# Patient Record
Sex: Male | Born: 1975 | Hispanic: No | Marital: Married | Smoking: Never smoker
Health system: Southern US, Community
[De-identification: ages and names within clinical notes are randomized; demographics above are authoritative.]

## PROBLEM LIST (undated history)

## (undated) DIAGNOSIS — J189 Pneumonia, unspecified organism: Secondary | ICD-10-CM

## (undated) DIAGNOSIS — E7800 Pure hypercholesterolemia, unspecified: Secondary | ICD-10-CM

---

## 2012-05-16 ENCOUNTER — Other Ambulatory Visit: Payer: Self-pay

## 2012-05-16 ENCOUNTER — Emergency Department (HOSPITAL_COMMUNITY): Payer: Medicaid - Out of State

## 2012-05-16 ENCOUNTER — Emergency Department (HOSPITAL_COMMUNITY)
Admission: EM | Admit: 2012-05-16 | Discharge: 2012-05-17 | Disposition: A | Discharge status: Home / Self Care | Payer: Medicaid - Out of State | Attending: Emergency Medicine | Admitting: Emergency Medicine

## 2012-05-16 ENCOUNTER — Encounter (HOSPITAL_COMMUNITY): Payer: Self-pay | Admitting: *Deleted

## 2012-05-16 DIAGNOSIS — E7800 Pure hypercholesterolemia, unspecified: Secondary | ICD-10-CM | POA: Insufficient documentation

## 2012-05-16 DIAGNOSIS — Z8701 Personal history of pneumonia (recurrent): Secondary | ICD-10-CM | POA: Insufficient documentation

## 2012-05-16 DIAGNOSIS — R0789 Other chest pain: Secondary | ICD-10-CM | POA: Insufficient documentation

## 2012-05-16 DIAGNOSIS — B9789 Other viral agents as the cause of diseases classified elsewhere: Secondary | ICD-10-CM | POA: Insufficient documentation

## 2012-05-16 DIAGNOSIS — B349 Viral infection, unspecified: Secondary | ICD-10-CM

## 2012-05-16 HISTORY — DX: Pneumonia, unspecified organism: J18.9

## 2012-05-16 HISTORY — DX: Pure hypercholesterolemia, unspecified: E78.00

## 2012-05-16 LAB — CBC
Hemoglobin: 15.9 g/dL (ref 13.0–17.0)
Platelets: 225 10*3/uL (ref 150–400)
RBC: 5.27 MIL/uL (ref 4.22–5.81)
WBC: 12.8 10*3/uL — ABNORMAL HIGH (ref 4.0–10.5)

## 2012-05-16 LAB — BASIC METABOLIC PANEL
CO2: 26 mEq/L (ref 19–32)
Chloride: 103 mEq/L (ref 96–112)
Glucose, Bld: 97 mg/dL (ref 70–99)
Sodium: 140 mEq/L (ref 135–145)

## 2012-05-16 LAB — POCT I-STAT TROPONIN I: Troponin i, poc: 0 ng/mL (ref 0.00–0.08)

## 2012-05-16 MED ORDER — ALBUTEROL SULFATE (5 MG/ML) 0.5% IN NEBU
2.5000 mg | INHALATION_SOLUTION | Freq: Once | RESPIRATORY_TRACT | Status: AC
  Administered 2012-05-17: 2.5 mg via RESPIRATORY_TRACT
  Filled 2012-05-16: qty 0.5

## 2012-05-16 NOTE — ED Notes (Signed)
Pt c/o chills, chest pains, and non-productive cough.  States he is from [place] and was dx with pneumonia 3-4 months ago and is worried about the same today.

## 2012-05-16 NOTE — ED Notes (Signed)
EKG completed and signed by Dr. Ray. 

## 2012-05-17 LAB — D-DIMER, QUANTITATIVE: D-Dimer, Quant: <0.27 ug/mL-FEU (ref 0.00–0.48)

## 2012-05-17 NOTE — ED Notes (Signed)
Pt states his chest still feels tight after tx, but he feels he is breathing better.

## 2012-05-17 NOTE — ED Notes (Signed)
Pt given sandwich per request.  Await papers for discharge.

## 2012-05-21 NOTE — ED Provider Notes (Signed)
History     CSN: [id number]  Arrival date & time 05/16/12  8295   First MD Initiated Contact with Patient 05/16/12 2302      Chief Complaint  Patient presents with  . Chills    (Consider location/radiation/quality/duration/timing/severity/associated sxs/prior treatment) HPI 36 yo male presents to the ER with complaint of cold chills, sharp chest pain, and cough x 3 days.  No documented fevers.  Daughter with URI sxs.  Pt reports pna about 3 mo ago in [place], sxs somewhat similar to prior.  Pt travels extensively for work.  No h/o PE, dvt, leg swelling, pain.    Past Medical History  Diagnosis Date  . Pneumonia   . Hypercholesteremia     History reviewed. No pertinent past surgical history.  History reviewed. No pertinent family history.  History  Substance Use Topics  . Smoking status: Never Smoker   . Smokeless tobacco: Not on file  . Alcohol Use: No      Review of Systems  All other systems reviewed and are negative.    Allergies  Review of patient's allergies indicates no known allergies.  Home Medications   Current Outpatient Rx  Name  Route  Sig  Dispense  Refill  . ACETAMINOPHEN 500 MG PO TABS   Oral   Take 1,000 mg by mouth every 6 (six) hours as needed. For pain         . DAYQUIL PO   Oral   Take 2 capsules by mouth every 6 (six) hours as needed. For cold and flu symptoms           BP 128/76  Pulse 71  Temp 98 F (36.7 C) (Oral)  Resp 20  SpO2 100%  Physical Exam  Nursing note and vitals reviewed. Constitutional: He is oriented to person, place, and time. He appears well-developed and well-nourished.  HENT:  Head: Normocephalic and atraumatic.  Right Ear: External ear normal.  Left Ear: External ear normal.  Nose: Nose normal.  Mouth/Throat: Oropharynx is clear and moist.  Eyes: Conjunctivae normal and EOM are normal. Pupils are equal, round, and reactive to light.  Neck: Normal range of motion. Neck supple. No JVD present. No  tracheal deviation present. No thyromegaly present.  Cardiovascular: Normal rate, regular rhythm, normal heart sounds and intact distal pulses.  Exam reveals no gallop and no friction rub.   No murmur heard. Pulmonary/Chest: Effort normal and breath sounds normal. No stridor. No respiratory distress. He has no wheezes. He has no rales. He exhibits no tenderness.       Cough noted  Abdominal: Soft. Bowel sounds are normal. He exhibits no distension and no mass. There is no tenderness. There is no rebound and no guarding.  Musculoskeletal: Normal range of motion. He exhibits no edema and no tenderness.  Lymphadenopathy:    He has no cervical adenopathy.  Neurological: He is alert and oriented to person, place, and time. He exhibits normal muscle tone. Coordination normal.  Skin: Skin is dry. No rash noted. No erythema. No pallor.  Psychiatric: He has a normal mood and affect. His behavior is normal. Judgment and thought content normal.    ED Course  Procedures (including critical care time)  Results for orders placed during the hospital encounter of 05/16/12  CBC      Component Value Range   WBC 12.8 (*) 4.0 - 10.5 K/uL   RBC 5.27  4.22 - 5.81 MIL/uL   Hemoglobin 15.9  13.0 - 17.0 g/dL  HCT 43.8  39.0 - 52.0 %   MCV 83.1  78.0 - 100.0 fL   MCH 30.2  26.0 - 34.0 pg   MCHC 36.3 (*) 30.0 - 36.0 g/dL   RDW 16.1  09.6 - 04.5 %   Platelets 225  150 - 400 K/uL  BASIC METABOLIC PANEL      Component Value Range   Sodium 140  135 - 145 mEq/L   Potassium 3.8  3.5 - 5.1 mEq/L   Chloride 103  96 - 112 mEq/L   CO2 26  19 - 32 mEq/L   Glucose, Bld 97  70 - 99 mg/dL   BUN 8  6 - 23 mg/dL   Creatinine, Ser 4.09  0.50 - 1.35 mg/dL   Calcium 9.5  8.4 - 81.1 mg/dL   GFR calc non Af Amer >90  >90 mL/min   GFR calc Af Amer >90  >90 mL/min  POCT I-STAT TROPONIN I      Component Value Range   Troponin i, poc 0.00  0.00 - 0.08 ng/mL   Comment 3           D-DIMER, QUANTITATIVE      Component Value  Range   D-Dimer, Quant <0.27  0.00 - 0.48 ug/mL-FEU   Dg Chest 2 View  05/16/2012  *RADIOLOGY REPORT*  Clinical Data: Chest pain.  CHEST - 2 VIEW  Comparison: None.  Findings: Cardiomediastinal silhouette appears normal.  Right lung is clear.  Minimal linear opacity is noted in left lung base consistent with scarring or subsegmental atelectasis.  Bony thorax is intact.  IMPRESSION: Minimal subsegmental atelectasis or scarring seen in left lung base.   Original Report Authenticated By: Lupita Raider.,  M.D.       Labs Reviewed  CBC - Abnormal; Notable for the following:    WBC 12.8 (*)     MCHC 36.3 (*)     All other components within normal limits  BASIC METABOLIC PANEL  POCT I-STAT TROPONIN I  D-DIMER, QUANTITATIVE  LAB REPORT - SCANNED   No results found.  Date: 05/21/2012  Rate: 74  Rhythm: normal sinus rhythm  QRS Axis: normal   Intervals: normal  ST/T Wave abnormalities: normal  Conduction Disutrbances:none  Narrative Interpretation:   Old EKG Reviewed: none available    1. Atypical chest pain   2. Viral syndrome       MDM  36 yo male with 3 days of cough, subjective fever/chills.  W/u negative, suspect viral syndrome        Olivia Mackie, MD 05/21/12 2124

## 2012-10-24 ENCOUNTER — Encounter (HOSPITAL_COMMUNITY): Payer: Self-pay | Admitting: Emergency Medicine

## 2012-10-24 ENCOUNTER — Emergency Department (HOSPITAL_COMMUNITY)
Admission: EM | Admit: 2012-10-24 | Discharge: 2012-10-24 | Disposition: A | Discharge status: Home / Self Care | Payer: Medicaid - Out of State | Attending: Emergency Medicine | Admitting: Emergency Medicine

## 2012-10-24 DIAGNOSIS — Z8701 Personal history of pneumonia (recurrent): Secondary | ICD-10-CM | POA: Insufficient documentation

## 2012-10-24 DIAGNOSIS — H579 Unspecified disorder of eye and adnexa: Secondary | ICD-10-CM | POA: Insufficient documentation

## 2012-10-24 DIAGNOSIS — E7800 Pure hypercholesterolemia, unspecified: Secondary | ICD-10-CM | POA: Insufficient documentation

## 2012-10-24 DIAGNOSIS — R509 Fever, unspecified: Secondary | ICD-10-CM | POA: Insufficient documentation

## 2012-10-24 DIAGNOSIS — R1310 Dysphagia, unspecified: Secondary | ICD-10-CM | POA: Insufficient documentation

## 2012-10-24 DIAGNOSIS — J029 Acute pharyngitis, unspecified: Secondary | ICD-10-CM | POA: Insufficient documentation

## 2012-10-24 DIAGNOSIS — J3489 Other specified disorders of nose and nasal sinuses: Secondary | ICD-10-CM | POA: Insufficient documentation

## 2012-10-24 DIAGNOSIS — R059 Cough, unspecified: Secondary | ICD-10-CM | POA: Insufficient documentation

## 2012-10-24 DIAGNOSIS — R05 Cough: Secondary | ICD-10-CM | POA: Insufficient documentation

## 2012-10-24 LAB — RAPID STREP SCREEN (MED CTR MEBANE ONLY): Streptococcus, Group A Screen (Direct): NEGATIVE

## 2012-10-24 MED ORDER — AZITHROMYCIN 250 MG PO TABS: ORAL_TABLET | ORAL | Status: DC

## 2012-10-24 NOTE — ED Notes (Signed)
PT. REPORTS SORE THROAT , RUNNY NOSE , ITCHY/TEARY EYES FOR 2 DAYS UNRELIEVED BY OTC ALLERGY MEDICATIONS , RESPIRATIONS UNLABORED / AIRWAY INTACT.

## 2012-10-24 NOTE — ED Provider Notes (Signed)
History    This chart was scribed for non-physician practitioner working with Todd Cooper III, MD by Todd Yang, ED Scribe. This patient was seen in room TR04C/TR04C and the patient's care was started at 8:52PM.   CSN: [id number]  Arrival date & time 10/24/12  1916   First MD Initiated Contact with Patient 10/24/12 2052      Chief Complaint  Patient presents with  . Sore Throat    (Consider location/radiation/quality/duration/timing/severity/associated sxs/prior treatment) Patient is a 37 y.o. male presenting with pharyngitis. The history is provided by the patient. No language interpreter was used.  Sore Throat This is a new problem. The current episode started 2 days ago. The problem occurs constantly. The problem has been gradually worsening. Pertinent negatives include no chest pain, no abdominal pain, no headaches and no shortness of breath. Nothing aggravates the symptoms. Nothing relieves the symptoms. He has tried nothing for the symptoms. The treatment provided no relief.    Todd Yang is a 37 y.o. male , with a hx of pneumonia and hypercholesteremia, who presents to the Emergency Department complaining of sudden, progressively worsening, sore throat, onset two days ago (10/22/12).  Associated symptoms include difficulty swallowing, rhinorrhea, non productive cough, itching located at the eyes bilaterally, and fever (subjective PTA, 98.2 at [facility] on 10/24/12). The pt reports he has been experiencing a sore, itchy sensation within his throat for the past two nights, which has prompted his concern and desire to seek medical evaluation at [facility] this evening. The pt has taken tylenol, ibuprofen, and Claritin D which do not provide relief of his sore throat.   The pt does not smoke or drink alcohol.     Past Medical History  Diagnosis Date  . Pneumonia   . Hypercholesteremia     History reviewed. No pertinent past surgical history.  No family history on file.  History   Substance Use Topics  . Smoking status: Never Smoker   . Smokeless tobacco: Not on file  . Alcohol Use: No      Review of Systems  Constitutional: Positive for fever.  HENT: Positive for sore throat, rhinorrhea and trouble swallowing.   Eyes: Positive for itching.  Respiratory: Positive for cough. Negative for shortness of breath.   Cardiovascular: Negative for chest pain.  Gastrointestinal: Negative for abdominal pain.  Neurological: Negative for headaches.  All other systems reviewed and are negative.    Allergies  Review of patient's allergies indicates no known allergies.  Home Medications   Current Outpatient Rx  Name  Route  Sig  Dispense  Refill  . acetaminophen (TYLENOL) 500 MG tablet   Oral   Take 500 mg by mouth every 6 (six) hours as needed. For pain         . diphenhydrAMINE (BENADRYL) 50 MG tablet   Oral   Take 50 mg by mouth at bedtime as needed for allergies.          Marland Kitchen ibuprofen (ADVIL,MOTRIN) 200 MG tablet   Oral   Take 200 mg by mouth every 6 (six) hours as needed for pain.         Marland Kitchen loratadine (CLARITIN) 10 MG tablet   Oral   Take 10 mg by mouth daily.           BP 146/82  Pulse 89  Temp(Src) 98.2 F (36.8 C) (Oral)  Resp 14  SpO2 96%  Physical Exam  Nursing note and vitals reviewed. Constitutional: He is oriented to person, place, and  time. He appears well-developed and well-nourished. No distress.  HENT:  Head: Normocephalic and atraumatic.  Right Ear: Tympanic membrane normal.  Left Ear: Tympanic membrane normal.  Mouth/Throat: Posterior oropharyngeal erythema present. No oropharyngeal exudate.  Eyes: EOM are normal.  Neck: Normal range of motion. Neck supple. No tracheal deviation present.  Cardiovascular: Normal rate, regular rhythm and normal heart sounds.  Exam reveals no gallop and no friction rub.   No murmur heard. Pulmonary/Chest: Effort normal and breath sounds normal. No respiratory distress. He has no wheezes.   Abdominal: Bowel sounds are normal. He exhibits no distension. There is no tenderness.  Musculoskeletal: Normal range of motion.  Lymphadenopathy:    He has cervical adenopathy (mild left sided. ).  Neurological: He is alert and oriented to person, place, and time.  Skin: Skin is warm and dry.  Psychiatric: He has a normal mood and affect. His behavior is normal.    ED Course  Procedures (including critical care time)  DIAGNOSTIC STUDIES: Oxygen Saturation is 96% on room air, normal by my interpretation.    COORDINATION OF CARE:   8:56 PM- Treatment plan discussed with patient. Pt agrees with treatment.     Results for orders placed during the hospital encounter of 10/24/12  RAPID STREP SCREEN      Result Value Range   Streptococcus, Group A Screen (Direct) NEGATIVE  NEGATIVE        No results found.   No diagnosis found.  Pharyngitis, likely viral.  Throat culture pending--patient will be contacted with results if positive.  MDM   I personally performed the services described in this documentation, which was scribed in my presence. The recorded information has been reviewed and is accurate.         Todd Norman, NP 10/24/12 [phone]

## 2012-10-25 LAB — STREP A DNA PROBE

## 2012-10-25 NOTE — ED Provider Notes (Signed)
Medical screening examination/treatment/procedure(s) were performed by non-physician practitioner and as supervising physician I was immediately available for consultation/collaboration.   [name], MD 10/25/12 [phone]

## 2013-06-28 ENCOUNTER — Emergency Department (HOSPITAL_COMMUNITY)
Admission: EM | Admit: 2013-06-28 | Discharge: 2013-06-28 | Disposition: A | Discharge status: Home / Self Care | Payer: Medicaid - Out of State | Attending: Emergency Medicine | Admitting: Emergency Medicine

## 2013-06-28 ENCOUNTER — Encounter (HOSPITAL_COMMUNITY): Payer: Self-pay | Admitting: Emergency Medicine

## 2013-06-28 DIAGNOSIS — Z79899 Other long term (current) drug therapy: Secondary | ICD-10-CM | POA: Insufficient documentation

## 2013-06-28 DIAGNOSIS — F411 Generalized anxiety disorder: Secondary | ICD-10-CM | POA: Insufficient documentation

## 2013-06-28 DIAGNOSIS — R42 Dizziness and giddiness: Secondary | ICD-10-CM

## 2013-06-28 DIAGNOSIS — Z8701 Personal history of pneumonia (recurrent): Secondary | ICD-10-CM | POA: Insufficient documentation

## 2013-06-28 DIAGNOSIS — Z8639 Personal history of other endocrine, nutritional and metabolic disease: Secondary | ICD-10-CM | POA: Insufficient documentation

## 2013-06-28 DIAGNOSIS — Z862 Personal history of diseases of the blood and blood-forming organs and certain disorders involving the immune mechanism: Secondary | ICD-10-CM | POA: Insufficient documentation

## 2013-06-28 LAB — COMPREHENSIVE METABOLIC PANEL
AST: 33 U/L (ref 0–37)
CO2: 27 mEq/L (ref 19–32)
Calcium: 9.9 mg/dL (ref 8.4–10.5)
Creatinine, Ser: 0.97 mg/dL (ref 0.50–1.35)
GFR calc Af Amer: >90 mL/min (ref >90)
GFR calc non Af Amer: >90 mL/min (ref >90)

## 2013-06-28 LAB — URINALYSIS, ROUTINE W REFLEX MICROSCOPIC
Hgb urine dipstick: NEGATIVE
Nitrite: NEGATIVE
Specific Gravity, Urine: 1.006 (ref 1.005–1.030)
Urobilinogen, UA: 0.2 mg/dL (ref 0.0–1.0)

## 2013-06-28 LAB — CBC WITH DIFFERENTIAL/PLATELET
Basophils Absolute: 0 10*3/uL (ref 0.0–0.1)
Eosinophils Absolute: 0.3 10*3/uL (ref 0.0–0.7)
Eosinophils Relative: 3 % (ref 0–5)
HCT: 47.8 % (ref 39.0–52.0)
Lymphocytes Relative: 24 % (ref 12–46)
MCH: 30.4 pg (ref 26.0–34.0)
MCHC: 36.2 g/dL — ABNORMAL HIGH (ref 30.0–36.0)
MCV: 84 fL (ref 78.0–100.0)
Monocytes Absolute: 0.7 10*3/uL (ref 0.1–1.0)
RDW: 12.9 % (ref 11.5–15.5)

## 2013-06-28 NOTE — ED Notes (Signed)
Bed: RU04 Expected date: 06/28/13 Expected time: 5:01 PM Means of arrival: Ambulance Comments: HTN

## 2013-06-28 NOTE — ED Notes (Signed)
Per EMS, Pt ran from top floor of apartment building to bottom floor c/o abdominal pressure and dizziness after eating [place] food.A&Ox4.Pt has hx of anxiety.

## 2013-06-28 NOTE — ED Provider Notes (Signed)
CSN: [id number]     Arrival date & time 06/28/13  1655 History   First MD Initiated Contact with Patient 06/28/13 1711     Chief Complaint  Patient presents with  . Anxiety  . Dizziness   (Consider location/radiation/quality/duration/timing/severity/associated sxs/prior Treatment) HPI Comments: Todd Yang is a 37 y.o. male who is here for evaluation of recurrent feelings of dizziness and ill ease. He presents by EMS for evaluation. He didn't today occurred after eating and when he felt a dizzy, and hot feelings when outside, on a balcony. He stayed outside in the cold air for about 30 minutes. He had to lie down because he felt weak. He did not lose consciousness. Previously, he has episodes like this when he has lost consciousness. He has been evaluated for these incidences several times over the last 15 years. He has had extensive evaluation including MRI and EEG. Apparently, nothing has been found as a causative factor. He is currently in [place], temporarily for a job assignment. He's not on any long-term prescribed medications. He denies chest pain, paresthesias, focal weakness, nausea, vomiting, dysuria, urinary frequency, or difficulty walking. There are no other known modifying factors.  Patient is a 37 y.o. male presenting with anxiety and dizziness. The history is provided by the patient.  Anxiety  Dizziness   Past Medical History  Diagnosis Date  . Pneumonia   . Hypercholesteremia    History reviewed. No pertinent past surgical history. No family history on file. History  Substance Use Topics  . Smoking status: Never Smoker   . Smokeless tobacco: Not on file  . Alcohol Use: No    Review of Systems  Neurological: Positive for dizziness.  All other systems reviewed and are negative.    Allergies  Review of patient's allergies indicates no known allergies.  Home Medications   Current Outpatient Rx  Name  Route  Sig  Dispense  Refill  . acetaminophen  (TYLENOL) 500 MG tablet   Oral   Take 500 mg by mouth every 6 (six) hours as needed. For pain         . azithromycin (ZITHROMAX Z-PAK) 250 MG tablet      Take 2 tabs on first day, then one tab each day until finished   6 tablet   0   . diphenhydrAMINE (BENADRYL) 50 MG tablet   Oral   Take 50 mg by mouth at bedtime as needed for allergies.          Marland Kitchen ibuprofen (ADVIL,MOTRIN) 200 MG tablet   Oral   Take 200 mg by mouth every 6 (six) hours as needed for pain.         Marland Kitchen loratadine (CLARITIN) 10 MG tablet   Oral   Take 10 mg by mouth daily.          BP 144/74  Pulse 89  Temp(Src) 98 F (36.7 C) (Oral)  Resp 16  SpO2 99% Physical Exam  Nursing note and vitals reviewed. Constitutional: He is oriented to person, place, and time. He appears well-developed and well-nourished.  HENT:  Head: Normocephalic and atraumatic.  Right Ear: External ear normal.  Left Ear: External ear normal.  Eyes: Conjunctivae and EOM are normal. Pupils are equal, round, and reactive to light.  Neck: Normal range of motion and phonation normal. Neck supple.  Cardiovascular: Normal rate, regular rhythm, normal heart sounds and intact distal pulses.   Pulmonary/Chest: Effort normal and breath sounds normal. He exhibits no bony tenderness.  Abdominal: Soft.  Normal appearance. There is no tenderness.  Musculoskeletal: Normal range of motion.  Neurological: He is alert and oriented to person, place, and time. No cranial nerve deficit or sensory deficit. He exhibits normal muscle tone. Coordination normal.  Skin: Skin is warm, dry and intact.  Psychiatric: His behavior is normal. Judgment and thought content normal.  He is anxious    ED Course  Procedures (including critical care time) Labs Review Labs Reviewed  CBC WITH DIFFERENTIAL - Abnormal; Notable for the following:    WBC 11.1 (*)    Hemoglobin 17.3 (*)    MCHC 36.2 (*)    All other components within normal limits  COMPREHENSIVE  METABOLIC PANEL - Abnormal; Notable for the following:    Glucose, Bld 132 (*)    ALT 57 (*)    All other components within normal limits  URINALYSIS, ROUTINE W REFLEX MICROSCOPIC   Imaging Review No results found.  EKG Interpretation    Date/Time:  Saturday June 28 2013 17:07:52 EST Ventricular Rate:  93 PR Interval:  130 QRS Duration: 91 QT Interval:  348 QTC Calculation: 433 R Axis:   62 Text Interpretation:  Sinus rhythm Probable inferior infarct, old Baseline wander in lead(s) V1 V5 since last tracing no significant change Confirmed by Effie Shy  MD, Willette Mudry ([id number]) on 06/28/2013 5:11:53 PM            MDM   1. Dizziness       Nonspecific dizziness, recurrent, with negative EP evaluation. He has had extensive evaluation previously, and had another episode today, typical of his usual episodes. It is possible that this represents panic disorder. He is stable for discharge for outpatient management.  Nursing Notes Reviewed/ Care Coordinated, and agree without changes. Applicable Imaging Reviewed.  Interpretation of Laboratory Data incorporated into ED treatment    Plan: Home Medications- usual; Home Treatments and Observation- rest; return here if the recommended treatment, does not improve the symptoms; Recommended follow up- PCP followup 1-2 weeks     Flint Melter, MD 06/29/13 [phone]

## 2013-11-09 ENCOUNTER — Encounter (HOSPITAL_COMMUNITY): Payer: Self-pay | Admitting: Emergency Medicine

## 2013-11-09 ENCOUNTER — Emergency Department (HOSPITAL_COMMUNITY)
Admission: EM | Admit: 2013-11-09 | Discharge: 2013-11-10 | Disposition: A | Discharge status: Home / Self Care | Payer: Medicaid - Out of State | Attending: Emergency Medicine | Admitting: Emergency Medicine

## 2013-11-09 DIAGNOSIS — Z862 Personal history of diseases of the blood and blood-forming organs and certain disorders involving the immune mechanism: Secondary | ICD-10-CM | POA: Insufficient documentation

## 2013-11-09 DIAGNOSIS — Z79899 Other long term (current) drug therapy: Secondary | ICD-10-CM | POA: Insufficient documentation

## 2013-11-09 DIAGNOSIS — Z8701 Personal history of pneumonia (recurrent): Secondary | ICD-10-CM | POA: Insufficient documentation

## 2013-11-09 DIAGNOSIS — Z792 Long term (current) use of antibiotics: Secondary | ICD-10-CM | POA: Insufficient documentation

## 2013-11-09 DIAGNOSIS — R112 Nausea with vomiting, unspecified: Secondary | ICD-10-CM | POA: Insufficient documentation

## 2013-11-09 DIAGNOSIS — R42 Dizziness and giddiness: Secondary | ICD-10-CM | POA: Insufficient documentation

## 2013-11-09 DIAGNOSIS — Z8639 Personal history of other endocrine, nutritional and metabolic disease: Secondary | ICD-10-CM | POA: Insufficient documentation

## 2013-11-09 DIAGNOSIS — R110 Nausea: Secondary | ICD-10-CM

## 2013-11-09 LAB — COMPREHENSIVE METABOLIC PANEL
ALBUMIN: 4.2 g/dL (ref 3.5–5.2)
ALT: 45 U/L (ref 0–53)
AST: 30 U/L (ref 0–37)
Alkaline Phosphatase: 77 U/L (ref 39–117)
BILIRUBIN TOTAL: 0.5 mg/dL (ref 0.3–1.2)
BUN: 10 mg/dL (ref 6–23)
CALCIUM: 9.3 mg/dL (ref 8.4–10.5)
CHLORIDE: 101 meq/L (ref 96–112)
CO2: 25 mEq/L (ref 19–32)
CREATININE: 0.85 mg/dL (ref 0.50–1.35)
GFR calc Af Amer: >90 mL/min (ref >90)
GFR calc non Af Amer: >90 mL/min (ref >90)
Glucose, Bld: 151 mg/dL — ABNORMAL HIGH (ref 70–99)
Potassium: 4.3 mEq/L (ref 3.7–5.3)
Sodium: 139 mEq/L (ref 137–147)
TOTAL PROTEIN: 7.9 g/dL (ref 6.0–8.3)

## 2013-11-09 LAB — CBC WITH DIFFERENTIAL/PLATELET
BASOS ABS: 0 10*3/uL (ref 0.0–0.1)
BASOS PCT: 0 % (ref 0–1)
EOS ABS: 0.8 10*3/uL — AB (ref 0.0–0.7)
EOS PCT: 7 % — AB (ref 0–5)
HEMATOCRIT: 45.5 % (ref 39.0–52.0)
HEMOGLOBIN: 16.5 g/dL (ref 13.0–17.0)
Lymphocytes Relative: 32 % (ref 12–46)
Lymphs Abs: 3.3 10*3/uL (ref 0.7–4.0)
MCH: 30.7 pg (ref 26.0–34.0)
MCHC: 36.3 g/dL — AB (ref 30.0–36.0)
MCV: 84.6 fL (ref 78.0–100.0)
MONO ABS: 0.7 10*3/uL (ref 0.1–1.0)
MONOS PCT: 7 % (ref 3–12)
Neutro Abs: 5.5 10*3/uL (ref 1.7–7.7)
Neutrophils Relative %: 54 % (ref 43–77)
Platelets: 235 10*3/uL (ref 150–400)
RBC: 5.38 MIL/uL (ref 4.22–5.81)
RDW: 13 % (ref 11.5–15.5)
WBC: 10.2 10*3/uL (ref 4.0–10.5)

## 2013-11-09 LAB — URINALYSIS, ROUTINE W REFLEX MICROSCOPIC
Bilirubin Urine: NEGATIVE
Glucose, UA: NEGATIVE mg/dL
Hgb urine dipstick: NEGATIVE
KETONES UR: NEGATIVE mg/dL
LEUKOCYTES UA: NEGATIVE
NITRITE: NEGATIVE
PH: 7 (ref 5.0–8.0)
PROTEIN: NEGATIVE mg/dL
Specific Gravity, Urine: 1.007 (ref 1.005–1.030)
Urobilinogen, UA: 0.2 mg/dL (ref 0.0–1.0)

## 2013-11-09 LAB — LIPASE, BLOOD: LIPASE: 26 U/L (ref 11–59)

## 2013-11-09 MED ORDER — FAMOTIDINE 20 MG PO TABS: 20.0000 mg | ORAL_TABLET | Freq: Two times a day (BID) | ORAL | Status: DC

## 2013-11-09 MED ORDER — ONDANSETRON HCL 4 MG/2ML IJ SOLN: 4.0000 mg | Freq: Once | INTRAMUSCULAR | Status: DC

## 2013-11-09 MED ORDER — ONDANSETRON 4 MG PO TBDP
8.0000 mg | ORAL_TABLET | Freq: Once | ORAL | Status: AC
  Administered 2013-11-09: 8 mg via ORAL
  Filled 2013-11-09: qty 2

## 2013-11-09 MED ORDER — PANTOPRAZOLE SODIUM 40 MG IV SOLR: 40.0000 mg | Freq: Once | INTRAVENOUS | Status: DC

## 2013-11-09 MED ORDER — SODIUM CHLORIDE 0.9 % IV BOLUS (SEPSIS): 1000.0000 mL | Freq: Once | INTRAVENOUS | Status: DC

## 2013-11-09 NOTE — ED Notes (Signed)
The pt is having nausea after eating some food with curry in it 4-5 hours ago.  No diarrhea. .  He had a similar  Episode last year was [facility] and he had no diagnosis

## 2013-11-09 NOTE — Discharge Instructions (Signed)
Nausea, Adult Nausea is the feeling that you have an upset stomach or have to vomit. Nausea by itself is not likely a serious concern, but it may be an early sign of more serious medical problems. As nausea gets worse, it can lead to vomiting. If vomiting develops, there is the risk of dehydration.  CAUSES   Viral infections.  Food poisoning.  Medicines.  Pregnancy.  Motion sickness.  Migraine headaches.  Emotional distress.  Severe pain from any source.  Alcohol intoxication. HOME CARE INSTRUCTIONS  Get plenty of rest.  Ask your caregiver about specific rehydration instructions.  Eat small amounts of food and sip liquids more often.  Take all medicines as told by your caregiver. SEEK MEDICAL CARE IF:  You have not improved after 2 days, or you get worse.  You have a headache. SEEK IMMEDIATE MEDICAL CARE IF:   You have a fever.  You faint.  You keep vomiting or have blood in your vomit.  You are extremely weak or dehydrated.  You have dark or bloody stools.  You have severe chest or abdominal pain. MAKE SURE YOU:  Understand these instructions.  Will watch your condition.  Will get help right away if you are not doing well or get worse. Document Released: 08/03/2004 Document Revised: 03/20/2012 Document Reviewed: 03/08/2011 Exit[employer] Patient Information 2014 [employer].  

## 2013-11-09 NOTE — ED Provider Notes (Signed)
CSN: [id number]     Arrival date & time 11/09/13  2034 History   First MD Initiated Contact with Patient 11/09/13 2340     Chief Complaint  Patient presents with  . Emesis     (Consider location/radiation/quality/duration/timing/severity/associated sxs/prior Treatment) HPI History provided by patient. Patient fasted all day long. For dinner he had a large meal with rice and curry.  Short time later he developed nausea and dizziness, he tried to vomit but was unable to. He went outside and sat on his porch for about 45 minutes and didn't feel better so he presents here for evaluation. He had blood work in triage. He did have some epigastric discomfort but that is now resolved. With symptoms complained of heavy breathing and anxiety. The symptoms are now all gone. He feels relatively better and would prefer to be discharged home. He was given Zofran by triage nurse.  Nausea completely resolved.    Past Medical History  Diagnosis Date  . Pneumonia   . Hypercholesteremia    History reviewed. No pertinent past surgical history. No family history on file. History  Substance Use Topics  . Smoking status: Never Smoker   . Smokeless tobacco: Not on file  . Alcohol Use: No    Review of Systems  Constitutional: Negative for fever and chills.  Respiratory: Negative for shortness of breath.   Cardiovascular: Negative for chest pain.  Gastrointestinal: Positive for nausea. Negative for vomiting.  Genitourinary: Negative for flank pain.  Musculoskeletal: Negative for back pain.  Skin: Negative for rash.  Neurological: Negative for weakness and headaches.  All other systems reviewed and are negative.     Allergies  Review of patient's allergies indicates no known allergies.  Home Medications   Prior to Admission medications   Medication Sig Start Date End Date Taking? Authorizing Provider  acetaminophen (TYLENOL) 500 MG tablet Take 500 mg by mouth every 6 (six) hours as needed. For  pain    Historical Provider, MD  azithromycin (ZITHROMAX Z-PAK) 250 MG tablet Take 2 tabs on first day, then one tab each day until finished 10/24/12   Jimmye Normanavid John Smith, NP  diphenhydrAMINE (BENADRYL) 50 MG tablet Take 50 mg by mouth at bedtime as needed for allergies.     Historical Provider, MD  ibuprofen (ADVIL,MOTRIN) 200 MG tablet Take 200 mg by mouth every 6 (six) hours as needed for pain.    Historical Provider, MD  loratadine (CLARITIN) 10 MG tablet Take 10 mg by mouth daily.    Historical Provider, MD   BP 144/83  Pulse 81  Temp(Src) 98.6 F (37 C) (Oral)  Resp 18  Wt 197 lb (89.359 kg)  SpO2 100% Physical Exam  Constitutional: He is oriented to person, place, and time. He appears well-developed and well-nourished.  HENT:  Head: Normocephalic and atraumatic.  Mouth/Throat: Oropharynx is clear and moist.  Eyes: EOM are normal. Pupils are equal, round, and reactive to light.  Neck: Neck supple.  Cardiovascular: Normal rate, regular rhythm and intact distal pulses.   Pulmonary/Chest: Effort normal and breath sounds normal. No respiratory distress.  Abdominal: Soft. Bowel sounds are normal. He exhibits no distension. There is no tenderness. There is no rebound and no guarding.  Musculoskeletal: Normal range of motion. He exhibits no edema.  Neurological: He is alert and oriented to person, place, and time.  Skin: Skin is warm and dry.    ED Course  Procedures (including critical care time) Labs Review Labs Reviewed  CBC WITH DIFFERENTIAL -  Abnormal; Notable for the following:    MCHC 36.3 (*)    Eosinophils Relative 7 (*)    Eosinophils Absolute 0.8 (*)    All other components within normal limits  COMPREHENSIVE METABOLIC PANEL - Abnormal; Notable for the following:    Glucose, Bld 151 (*)    All other components within normal limits  LIPASE, BLOOD  URINALYSIS, ROUTINE W REFLEX MICROSCOPIC   zofran ODT  On recheck is asymptomatic, no abdominal pain and on exam is  soft, nontender and nondistended.   Patient declines IV fluids and IV Protonix.   He is agreeable to prescription for Pepcid. [place] in [place] on traveling back to [place] in a few months. He agrees to strict return precautions. He is a reliable historian and is stable and appropriate for discharge this time.   MDM   Diagnosis: Nausea after ingesting a large meal  Otherwise healthy adult male, presents with nausea and abdominal discomfort after eating a large meal consisting of rice and currry. At time of my evaluation he is feeling better. He had Zofran per triage nurse. He had labs and urinalysis were obtained and reviewed as above. No leukocytosis. Normal LFTs. Normal lipase. Vital signs and nursing notes reviewed. No acute abdomen or indication for abdominal imaging at this time.     Sunnie NielsenBrian Tasharra Nodine, MD 11/09/13 [phone]

## 2013-11-09 NOTE — ED Notes (Signed)
Pt reports he is feeling better since nausea medication.

## 2015-11-01 ENCOUNTER — Ambulatory Visit (INDEPENDENT_AMBULATORY_CARE_PROVIDER_SITE_OTHER): Payer: Managed Care, Other (non HMO) | Admitting: Physician Assistant

## 2015-11-01 VITALS — BP 112/76 | HR 68 | Temp 97.9°F | Resp 18 | Wt 196.0 lb

## 2015-11-01 DIAGNOSIS — J309 Allergic rhinitis, unspecified: Secondary | ICD-10-CM | POA: Diagnosis not present

## 2015-11-01 DIAGNOSIS — J029 Acute pharyngitis, unspecified: Secondary | ICD-10-CM | POA: Diagnosis not present

## 2015-11-01 LAB — POCT RAPID STREP A (OFFICE): RAPID STREP A SCREEN: NEGATIVE

## 2015-11-01 MED ORDER — AZELASTINE HCL 0.1 % NA SOLN: 2.0000 | Freq: Two times a day (BID) | NASAL | Status: AC

## 2015-11-01 MED ORDER — MONTELUKAST SODIUM 10 MG PO TABS: 10.0000 mg | ORAL_TABLET | Freq: Every day | ORAL | Status: AC

## 2015-11-01 NOTE — Addendum Note (Signed)
Addended by: Damita LackLORING, DONNA S on: 11/01/2015 06:55 PM   Modules accepted: Orders

## 2015-11-01 NOTE — Progress Notes (Signed)
[facility] [address], [place] [address] [phone]  Date:  11/01/2015   Name:  Todd Yang   DOB:  01-19-1976   MRN:  [id number]  PCP:  No PCP Per Patient    Chief Complaint: Sore Throat and Nasal Congestion   History of Present Illness:  This is a 40 y.o. male with PMH allergic rhinitis who is presenting with problems with allergies x 1 month. Complaining of sore throat and nasal congestion. States his throat is becoming very sore and feels like there is something stuck in his throat, esp when he swallows. Nose is starting to feel very sore and there is blood on the tissue in the mornings when he blows his nose. His regular doctor gave him a 30 day sample of zyrtec. He has been taking and not helping. He is not taking anything else for his allergies. States he tried flonase a few years ago and did not help. He gets these same symptoms every year around this time and states he usually ends up needing a zpak. He denies fever, chills, cough.  He has never tried singulair or any other nasal sprays.  Review of Systems:  Review of Systems See HPI  There are no active problems to display for this patient.   Prior to Admission medications   Medication Sig Start Date End Date Taking? Authorizing Provider  cetirizine (ZYRTEC) 10 MG tablet Take 10 mg by mouth daily.   Yes Historical Provider, MD  acetaminophen (TYLENOL) 500 MG tablet Take 500 mg by mouth every 6 (six) hours as needed. Reported on 11/01/2015   prn Historical Provider, MD  diphenhydrAMINE (BENADRYL) 50 MG tablet Take 50 mg by mouth at bedtime as needed for allergies. Reported on 11/01/2015   prn Historical Provider, MD    No Known Allergies  History reviewed. No pertinent past surgical history.  Social History  Substance Use Topics  . Smoking status: Never Smoker   . Smokeless tobacco: None  . Alcohol Use: No    History reviewed. No pertinent family history.  Medication list has been  reviewed and updated.  Physical Examination:  Physical Exam  Constitutional: He is oriented to person, place, and time. He appears well-developed and well-nourished. No distress.  HENT:  Head: Normocephalic and atraumatic.  Right Ear: Hearing, external ear and ear canal normal. Tympanic membrane is retracted.  Left Ear: Hearing, external ear and ear canal normal. Tympanic membrane is retracted.  Nose: Nose normal.  Mouth/Throat: Uvula is midline and mucous membranes are normal. Posterior oropharyngeal erythema present. No oropharyngeal exudate, posterior oropharyngeal edema or tonsillar abscesses.  Eyes: Conjunctivae and lids are normal. Right eye exhibits no discharge. Left eye exhibits no discharge. No scleral icterus.  Cardiovascular: Normal rate, regular rhythm, normal heart sounds and normal pulses.   No murmur heard. Pulmonary/Chest: Effort normal and breath sounds normal. No respiratory distress. He has no wheezes. He has no rhonchi. He has no rales.  Musculoskeletal: Normal range of motion.  Lymphadenopathy:       Head (right side): No submental, no submandibular and no tonsillar adenopathy present.       Head (left side): No submental, no submandibular and no tonsillar adenopathy present.    He has no cervical adenopathy.  Neurological: He is alert and oriented to person, place, and time.  Skin: Skin is warm, dry and intact. No lesion and no rash noted.  Psychiatric: He has a normal mood and affect. His speech is  normal and behavior is normal. Thought content normal.   BP 112/76 mmHg  Pulse 68  Temp(Src) 97.9 F (36.6 C) (Oral)  Resp 18  Wt 196 lb (88.905 kg)  SpO2 99%  Results for orders placed or performed in visit on 11/01/15  POCT rapid strep A  Result Value Ref Range   Rapid Strep A Screen Negative Negative   Assessment and Plan:  1. Sore throat 2. Allergic rhinitis Rapid strep negative, culture pending. Pt is adamant about needing antibiotics, stating that is  the only thing that helps his allergies. Allergies are uncontrolled and he has not found a regimen that works well for him. I am going to have him stop the zyrtec, start singulair QHS and start astelin nasal spray 2 sprays in each nostril once a day. If still not better in 2 weeks, he will call, and I will prescribe a zpak at that time. - POCT rapid strep A - montelukast (SINGULAIR) 10 MG tablet; Take 1 tablet (10 mg total) by mouth at bedtime.  Dispense: 30 tablet; Refill: 1 - azelastine (ASTELIN) 0.1 % nasal spray; Place 2 sprays into both nostrils 2 (two) times daily. Use in each nostril as directed  Dispense: 30 mL; Refill: 1   Bettyanne Dittman V. Dyke BrackettBush, PA-C, MHS [facility]  11/01/2015

## 2015-11-01 NOTE — Patient Instructions (Addendum)
Stop zyrtec Start singulair at night before bed. Use astelin nasal spray, 2 sprays in each nostril in the mornings. If you are not getting better in 2 weeks, call me and i will send in a zpak.    IF you received an x-ray today, you will receive an invoice from [facility]. Please contact [facility] at [phone] with questions or concerns regarding your invoice.   IF you received labwork today, you will receive an invoice from [facility]. Please contact [facility] at [phone] with questions or concerns regarding your invoice.   Our billing staff will not be able to assist you with questions regarding bills from these companies.  You will be contacted with the lab results as soon as they are available. The fastest way to get your results is to activate your My Chart account. Instructions are located on the last page of this paperwork. If you have not heard from us regarding the results in 2 weeks, please contact this office.

## 2015-11-04 LAB — CULTURE, GROUP A STREP: ORGANISM ID, BACTERIA: NORMAL

## 2015-11-07 ENCOUNTER — Encounter: Payer: Self-pay | Admitting: *Deleted

## 2019-04-29 ENCOUNTER — Other Ambulatory Visit: Payer: Self-pay | Admitting: Family Medicine

## 2019-04-29 DIAGNOSIS — R7401 Elevation of levels of liver transaminase levels: Secondary | ICD-10-CM

## 2019-05-07 ENCOUNTER — Ambulatory Visit
Admission: RE | Admit: 2019-05-07 | Discharge: 2019-05-07 | Disposition: A | Discharge status: Home / Self Care | Payer: 59 | Source: Ambulatory Visit | Attending: Family Medicine | Admitting: Family Medicine

## 2019-05-07 DIAGNOSIS — R7401 Elevation of levels of liver transaminase levels: Secondary | ICD-10-CM

## 2019-11-20 IMAGING — US US ABDOMEN LIMITED
1 series · 14 of 25 positions shown · non-contrast
Comparison: None.

CLINICAL DATA: Elevated transaminase

EXAM:
ULTRASOUND ABDOMEN LIMITED RIGHT UPPER QUADRANT

[Series 1: us abdomen limited · 0.16mm/px · 14 of 50 slices shown]
[im 1/50]
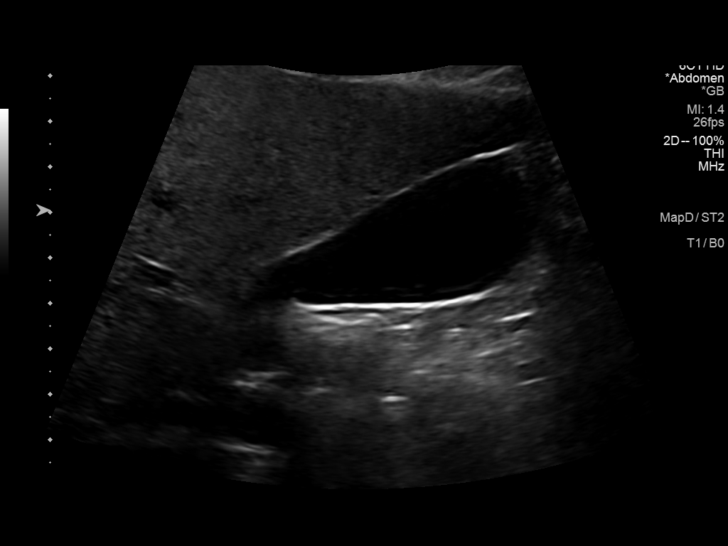
[im 5/50]
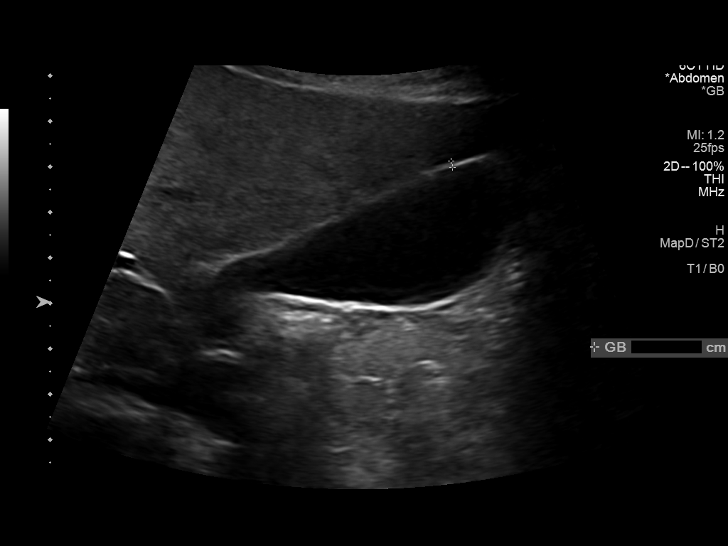
[im 9/50]
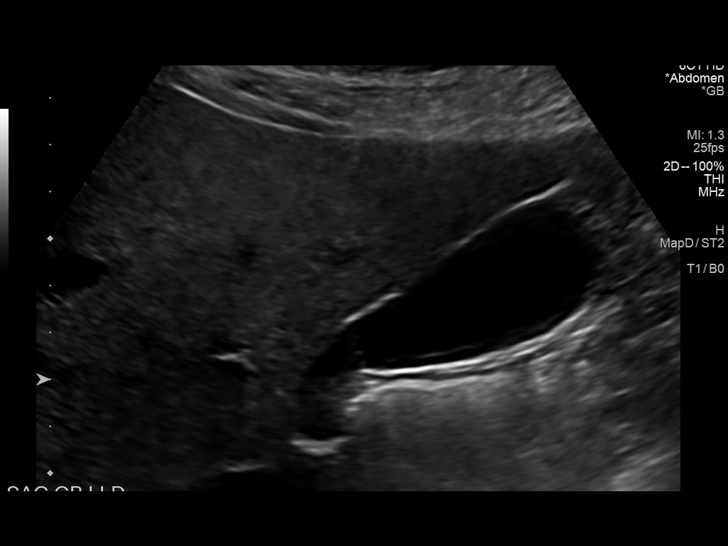
[im 13/50]
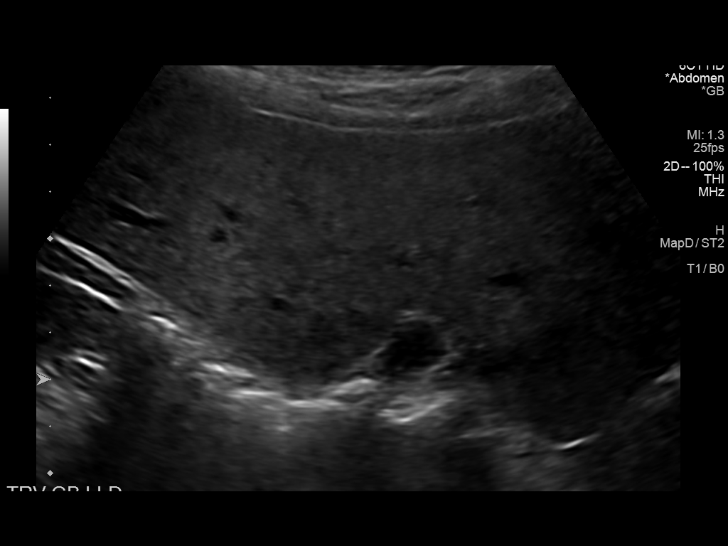
[im 17/50]
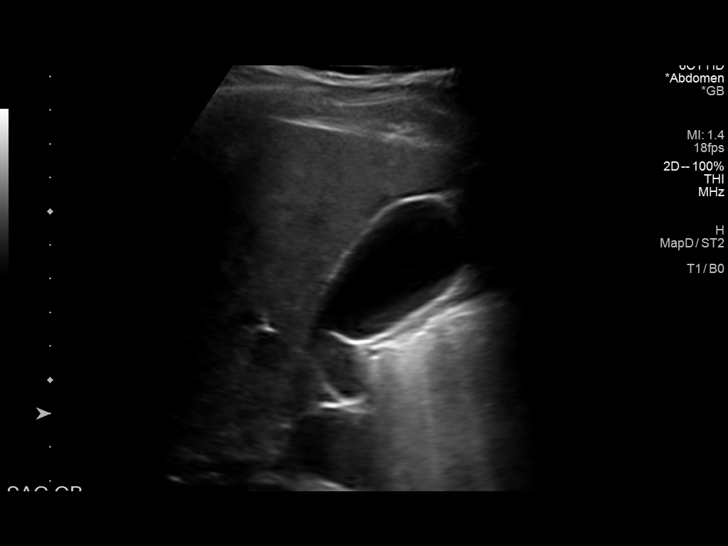
[im 19/50]
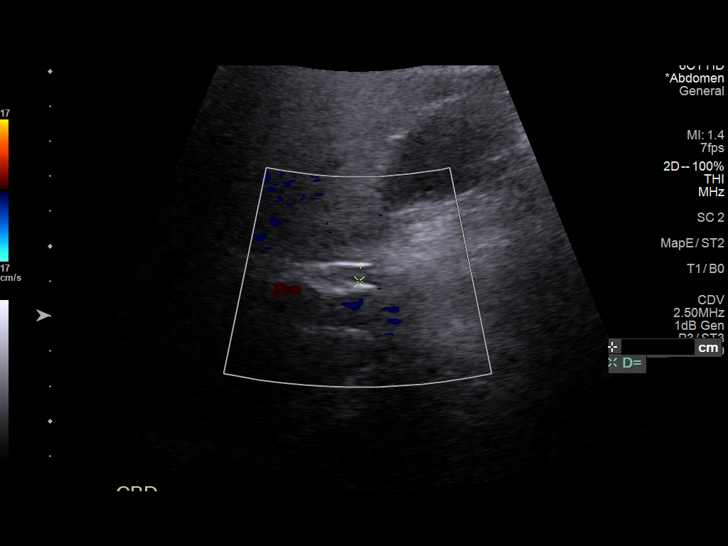
[im 23/50]
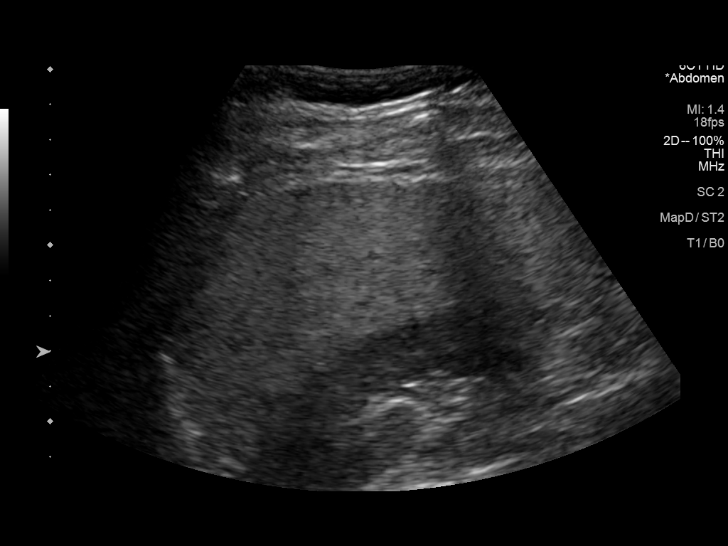
[im 27/50]
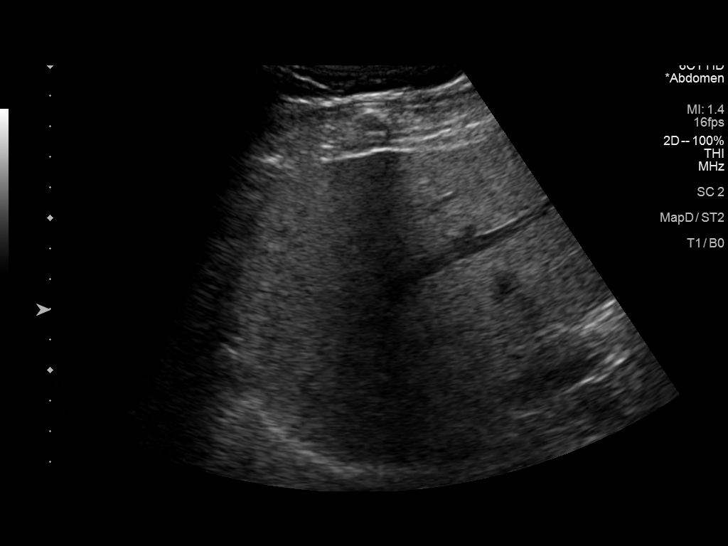
[im 31/50]
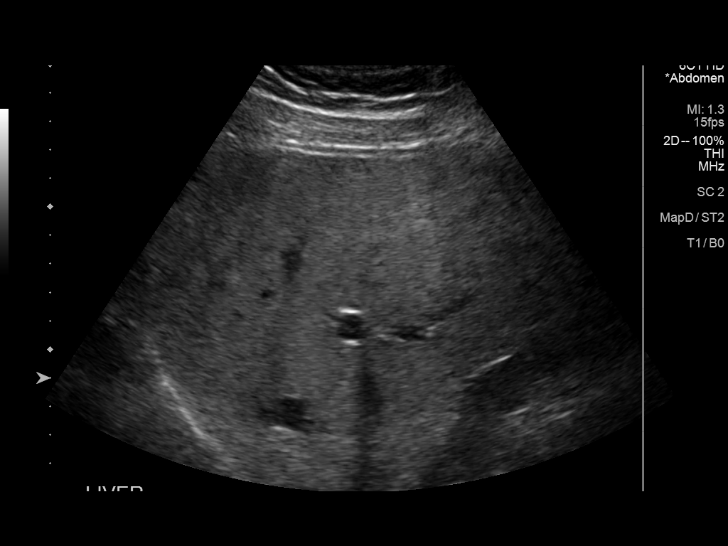
[im 33/50]
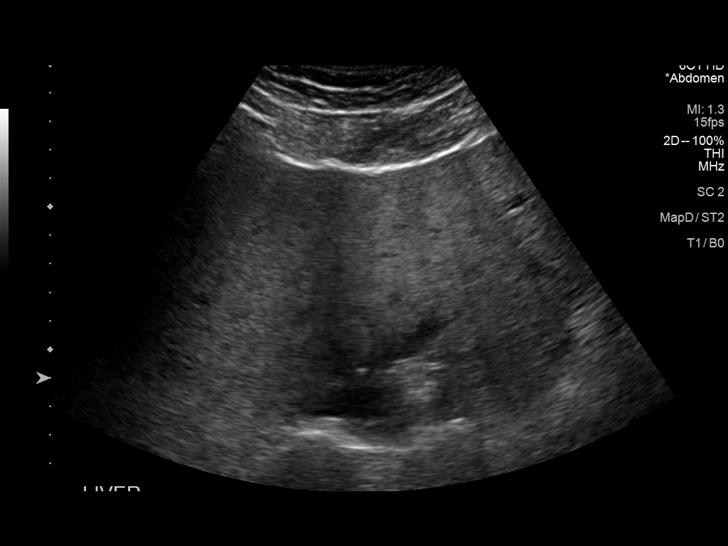
[im 37/50]
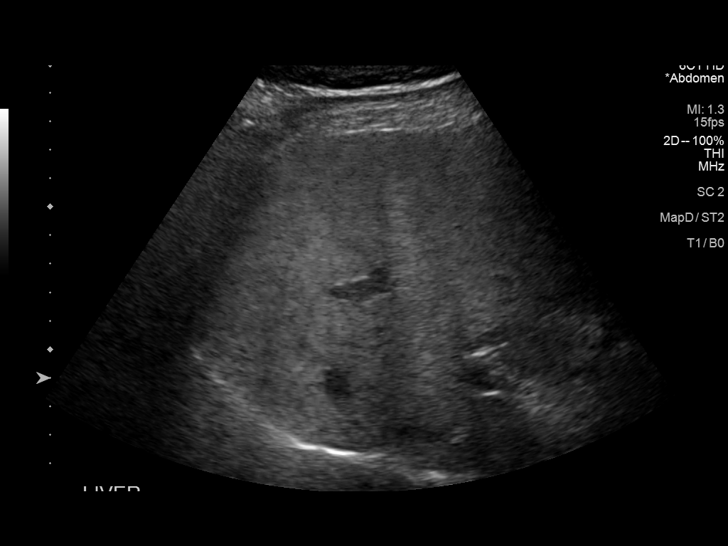
[im 41/50]
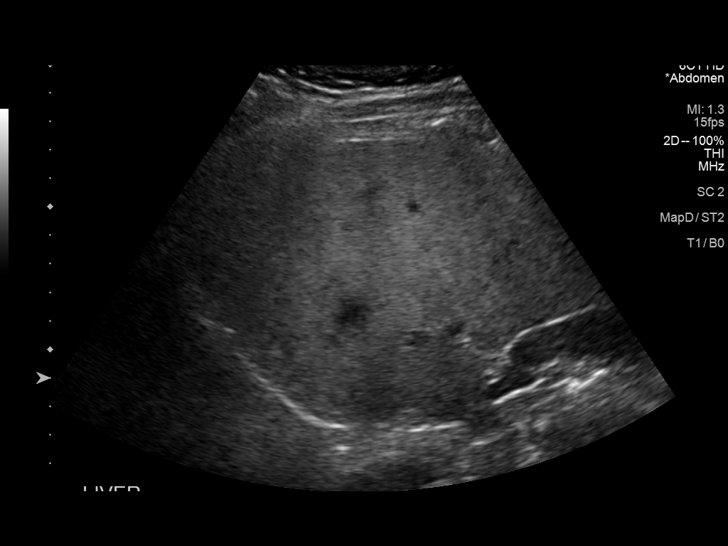
[im 45/50]
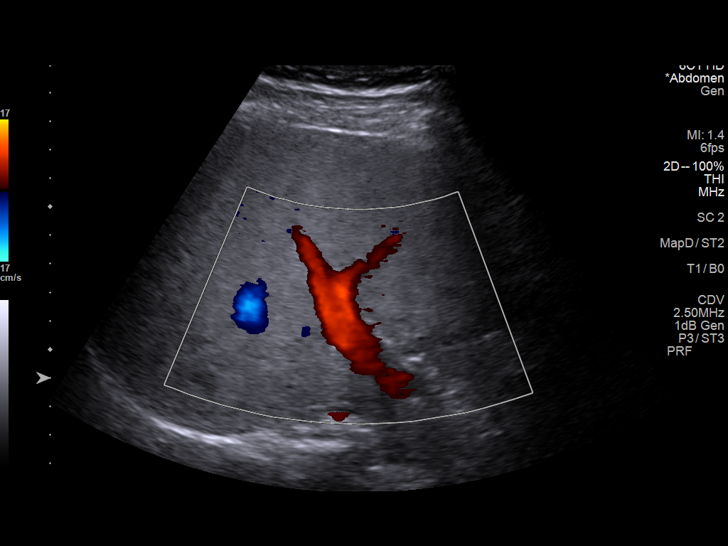
[im 50/50]
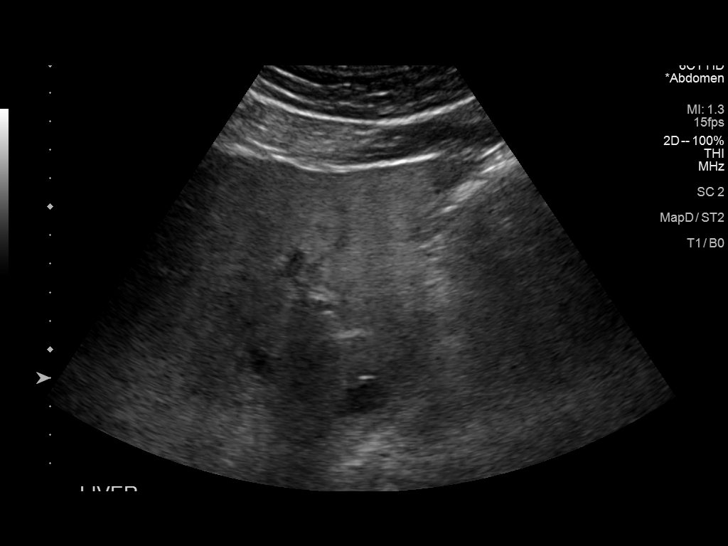

[14 of 25 positions shown; findings below may reference images not displayed]

FINDINGS: Gallbladder:

No gallstones or wall thickening visualized. No sonographic Murphy
sign noted by sonographer.

Common bile duct:

Diameter: Normal caliber, 5 mm

Liver:

Increased echotexture compatible with fatty infiltration. No focal
abnormality or biliary ductal dilatation. Portal vein is patent on
color Doppler imaging with normal direction of blood flow towards
the liver.

Other: None.
IMPRESSION: Fatty infiltration of the liver.  No acute findings.
